# Patient Record
Sex: Female | Born: 2005 | Race: Black or African American | Hispanic: No | Marital: Single | Smoking: Never smoker
Health system: Southern US, Community
[De-identification: ages and names within clinical notes are randomized; demographics above are authoritative.]

## PROBLEM LIST (undated history)

## (undated) DIAGNOSIS — L309 Dermatitis, unspecified: Secondary | ICD-10-CM

## (undated) DIAGNOSIS — J45909 Unspecified asthma, uncomplicated: Secondary | ICD-10-CM

## (undated) HISTORY — PX: NO PAST SURGERIES: SHX2092

## (undated) HISTORY — DX: Dermatitis, unspecified: L30.9

## (undated) HISTORY — DX: Unspecified asthma, uncomplicated: J45.909

---
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
## 2025-03-19 ENCOUNTER — Encounter: Admitting: Family Medicine
